# Patient Record
Sex: Female | Born: 1965 | Race: White | Hispanic: No | Marital: Married | Smoking: Current every day smoker
Health system: Southern US, Community
[De-identification: ages and names within clinical notes are randomized; demographics above are authoritative.]

## PROBLEM LIST (undated history)

## (undated) DIAGNOSIS — I10 Essential (primary) hypertension: Secondary | ICD-10-CM

## (undated) HISTORY — PX: TUBAL LIGATION: SHX77

---

## 2006-02-11 ENCOUNTER — Ambulatory Visit (HOSPITAL_COMMUNITY): Admission: RE | Admit: 2006-02-11 | Discharge: 2006-02-11 | Payer: Self-pay | Admitting: Obstetrics & Gynecology

## 2006-03-13 ENCOUNTER — Ambulatory Visit: Payer: Self-pay | Admitting: Orthopedic Surgery

## 2008-06-25 IMAGING — CR DG CERVICAL SPINE COMPLETE 4+V
5 series · 5 of 5 positions shown · non-contrast
Comparison: None.

CLINICAL DATA: 40-year-old female with a tingling in arms for over one year.
CERVICAL SPINE ? 4 VIEW:

[view not recorded (1 of 5)]
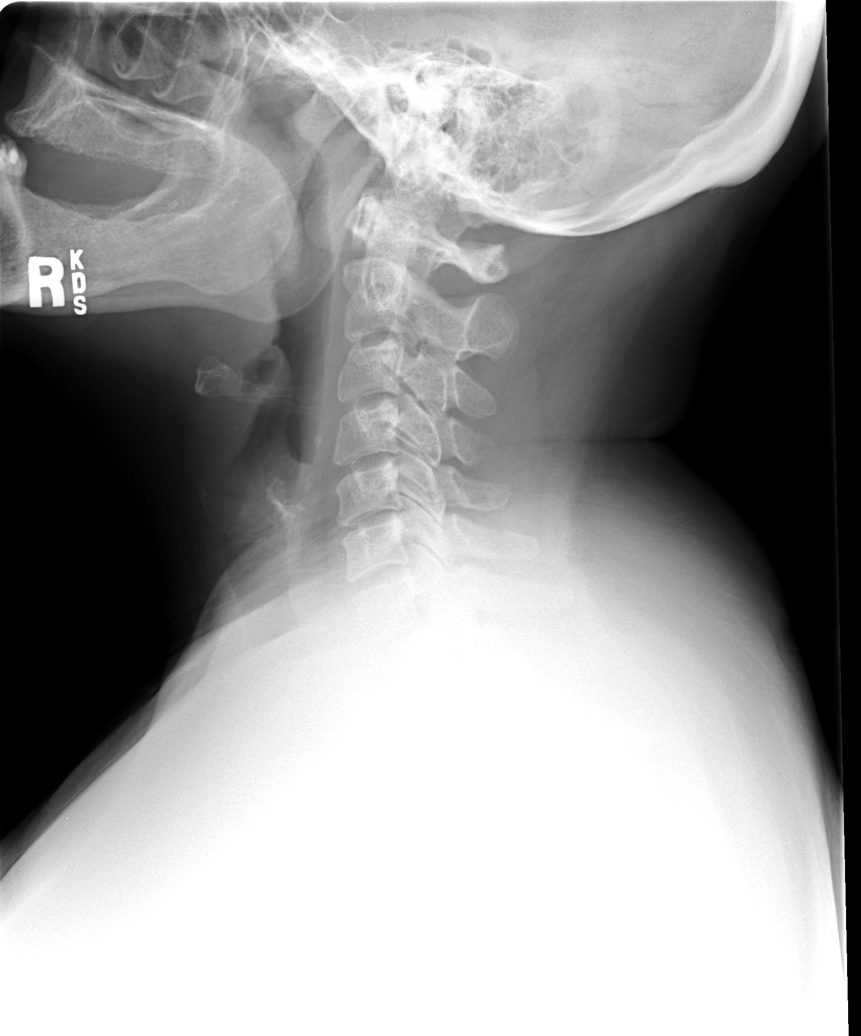

[view not recorded (2 of 5)]
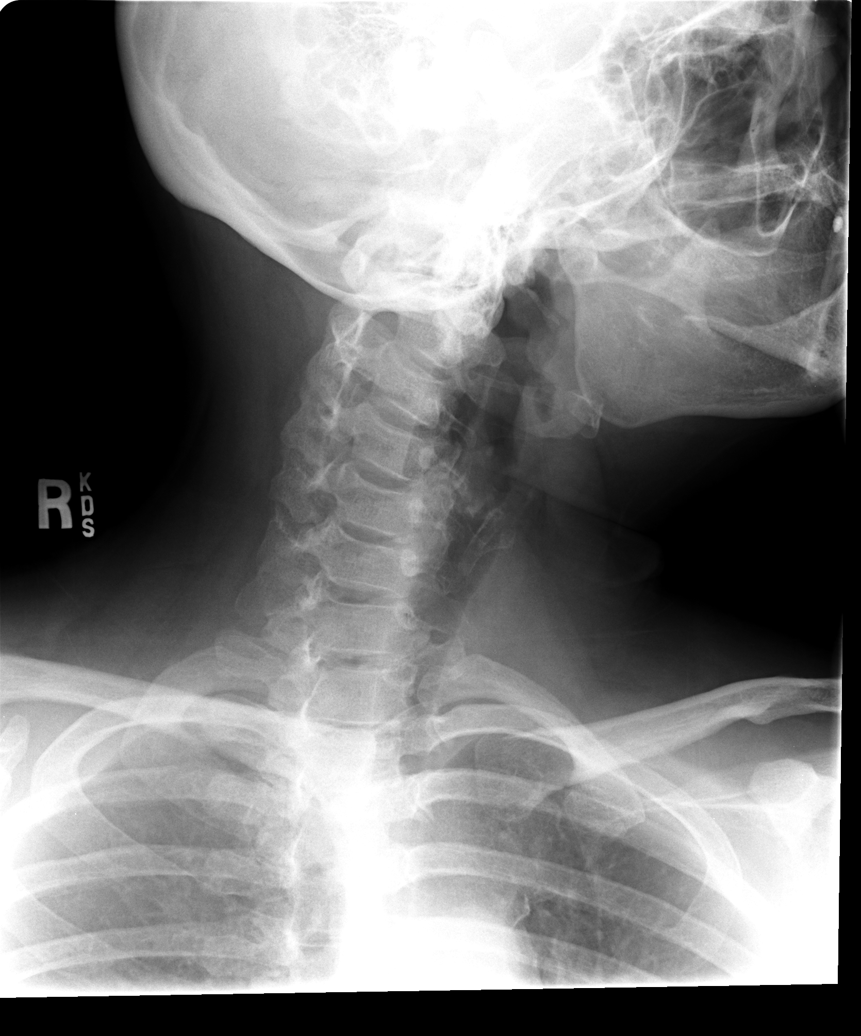

[view not recorded (3 of 5)]
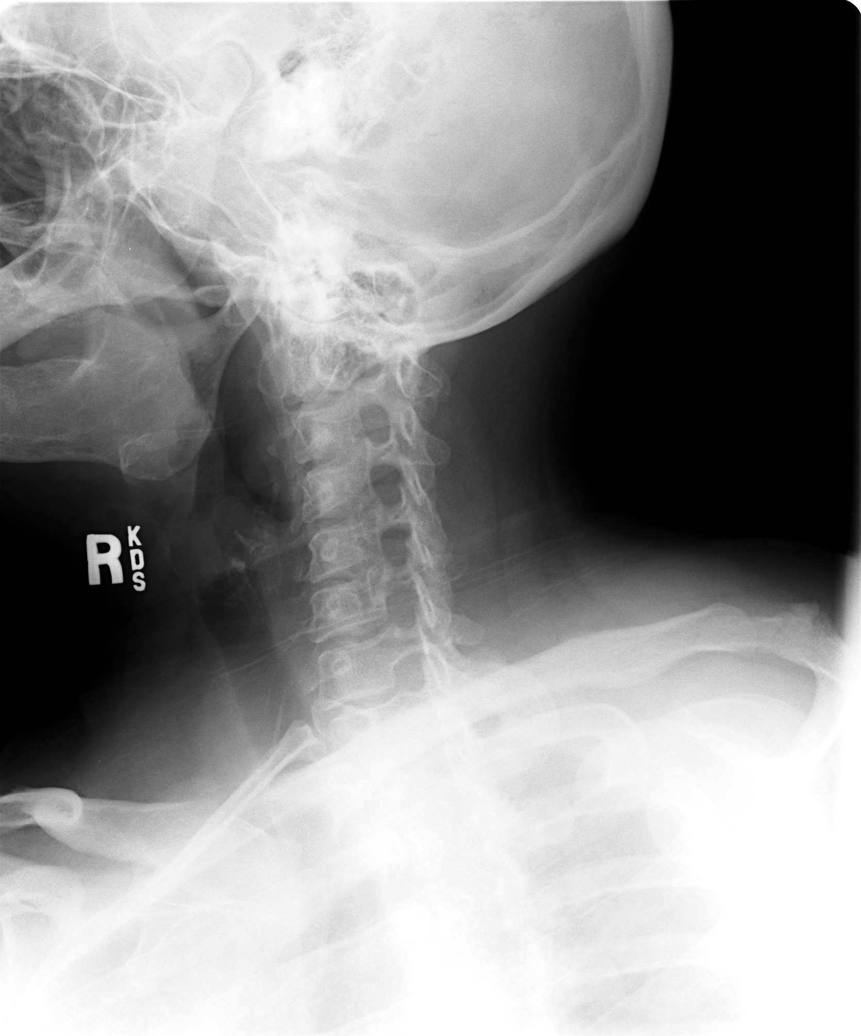

[view not recorded (4 of 5)]
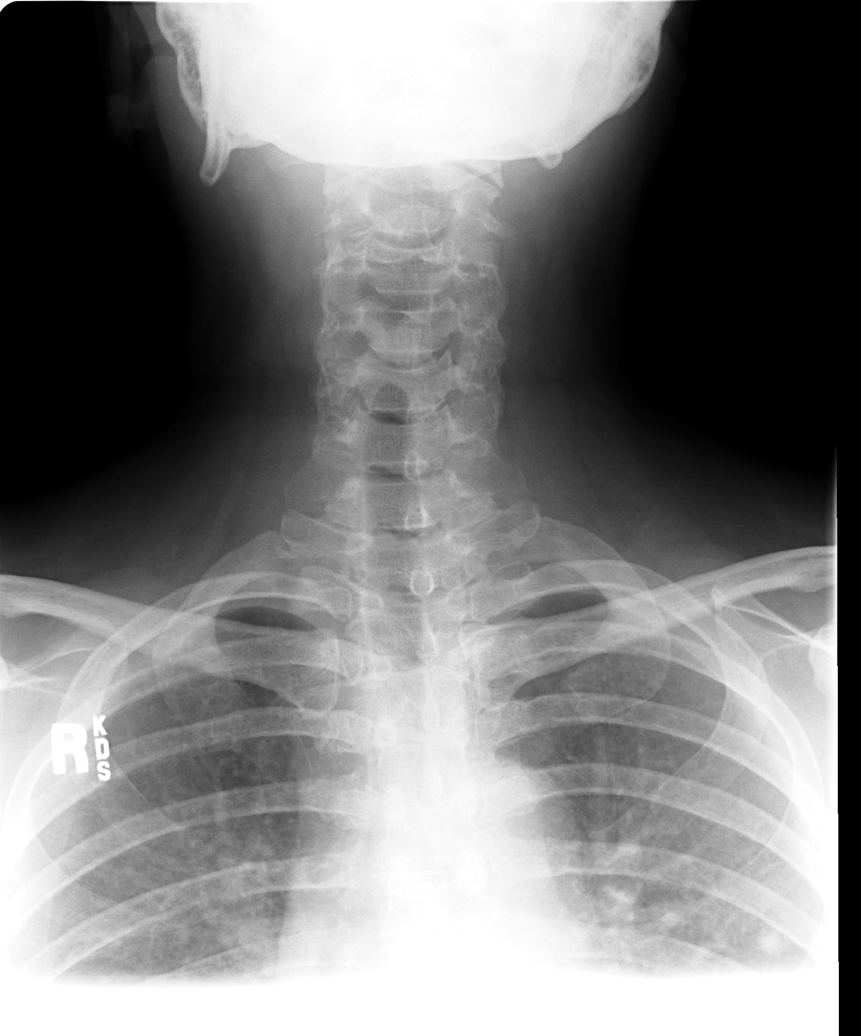

[view not recorded (5 of 5)]
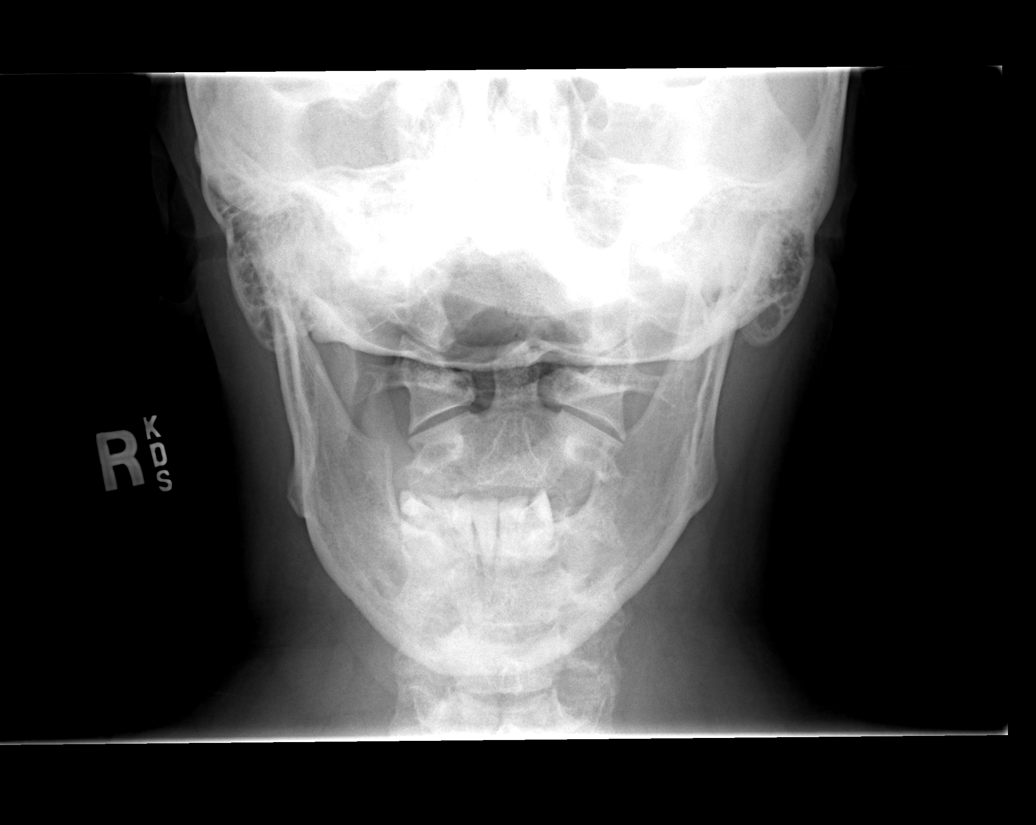

[5 of 5 positions shown; findings below may reference images not displayed]

FINDINGS: The alignment of the cervical spine is normal.  The vertebral body heights and the disk spaces are well preserved.  The facet joints are all well aligned.  No significant neural foraminal stenosis is noted on plain films.  Patient is noted to be edentulous.
IMPRESSION: 1.   No acute findings. 
2.  If patient?s symptoms persist, an MRI would be recommended.

## 2011-04-16 ENCOUNTER — Encounter (HOSPITAL_COMMUNITY): Payer: Self-pay | Admitting: Emergency Medicine

## 2011-04-16 ENCOUNTER — Emergency Department (HOSPITAL_COMMUNITY)
Admission: EM | Admit: 2011-04-16 | Discharge: 2011-04-16 | Disposition: A | Discharge status: Home / Self Care | Payer: BC Managed Care – PPO | Attending: Emergency Medicine | Admitting: Emergency Medicine

## 2011-04-16 DIAGNOSIS — L409 Psoriasis, unspecified: Secondary | ICD-10-CM

## 2011-04-16 DIAGNOSIS — F17200 Nicotine dependence, unspecified, uncomplicated: Secondary | ICD-10-CM | POA: Insufficient documentation

## 2011-04-16 DIAGNOSIS — R61 Generalized hyperhidrosis: Secondary | ICD-10-CM | POA: Insufficient documentation

## 2011-04-16 DIAGNOSIS — L408 Other psoriasis: Secondary | ICD-10-CM | POA: Insufficient documentation

## 2011-04-16 DIAGNOSIS — J3489 Other specified disorders of nose and nasal sinuses: Secondary | ICD-10-CM | POA: Insufficient documentation

## 2011-04-16 DIAGNOSIS — H81399 Other peripheral vertigo, unspecified ear: Secondary | ICD-10-CM | POA: Insufficient documentation

## 2011-04-16 DIAGNOSIS — R112 Nausea with vomiting, unspecified: Secondary | ICD-10-CM | POA: Insufficient documentation

## 2011-04-16 DIAGNOSIS — I10 Essential (primary) hypertension: Secondary | ICD-10-CM | POA: Insufficient documentation

## 2011-04-16 HISTORY — DX: Essential (primary) hypertension: I10

## 2011-04-16 LAB — GLUCOSE, CAPILLARY

## 2011-04-16 LAB — POCT I-STAT TROPONIN I: Troponin i, poc: 0 ng/mL (ref 0.00–0.08)

## 2011-04-16 MED ORDER — TRIAMCINOLONE ACETONIDE 0.1 % EX CREA: TOPICAL_CREAM | Freq: Two times a day (BID) | CUTANEOUS | Status: AC

## 2011-04-16 NOTE — ED Notes (Signed)
Pt c/o dizziness/n/clamy and vomiting x 1 at 1500 today. Pt denies ha/cp/sob.

## 2011-04-16 NOTE — Discharge Instructions (Signed)
Vertigo  Vertigo means you feel like you or your surroundings are moving when they are not. Vertigo can be dangerous if it occurs when you are at work, driving, or performing difficult activities.   CAUSES   Vertigo occurs when there is a conflict of signals sent to your brain from the visual and sensory systems in your body. There are many different causes of vertigo, including:   Infections, especially in the inner ear.   A bad reaction to a drug or misuse of alcohol and medicines.   Withdrawal from drugs or alcohol.   Rapidly changing positions, such as lying down or rolling over in bed.   A migraine headache.   Decreased blood flow to the brain.   Increased pressure in the brain from a head injury, infection, tumor, or bleeding.  SYMPTOMS   You may feel as though the world is spinning around or you are falling to the ground. Because your balance is upset, vertigo can cause nausea and vomiting. You may have involuntary eye movements (nystagmus).  DIAGNOSIS   Vertigo is usually diagnosed by physical exam. If the cause of your vertigo is unknown, your caregiver may perform imaging tests, such as an MRI scan (magnetic resonance imaging).  TREATMENT   Most cases of vertigo resolve on their own, without treatment. Depending on the cause, your caregiver may prescribe certain medicines. If your vertigo is related to body position issues, your caregiver may recommend movements or procedures to correct the problem. In rare cases, if your vertigo is caused by certain inner ear problems, you may need surgery.  HOME CARE INSTRUCTIONS    Follow your caregiver's instructions.   Avoid driving.   Avoid operating heavy machinery.   Avoid performing any tasks that would be dangerous to you or others during a vertigo episode.   Tell your caregiver if you notice that certain medicines seem to be causing your vertigo. Some of the medicines used to treat vertigo episodes can actually make them worse in some people.  SEEK  IMMEDIATE MEDICAL CARE IF:    Your medicines do not relieve your vertigo or are making it worse.   You develop problems with talking, walking, weakness, or using your arms, hands, or legs.   You develop severe headaches.   Your nausea or vomiting continues or gets worse.   You develop visual changes.   A family member notices behavioral changes.   Your condition gets worse.  MAKE SURE YOU:   Understand these instructions.   Will watch your condition.   Will get help right away if you are not doing well or get worse.  Document Released: 10/10/2004 Document Revised: 12/20/2010 Document Reviewed: 07/19/2010  Exit[employer] Patient Information 2012 [employer]          Dizziness  Dizziness is a common problem. It is a feeling of unsteadiness or lightheadedness. You may feel like you are about to faint. Dizziness can lead to injury if you stumble or fall. A person of any age group can suffer from dizziness, but dizziness is more common in older adults.  CAUSES   Dizziness can be caused by many different things, including:   Middle ear problems.   Standing for too long.   Infections.   An allergic reaction.   Aging.   An emotional response to something, such as the sight of blood.   Side effects of medicines.   Fatigue.   Problems with circulation or blood pressure.   Excess use of alcohol,   medicines, or illegal drug use.   Breathing too fast (hyperventilation).   An arrhythmia or problems with your heart rhythm.   Low red blood cell count (anemia).   Pregnancy.   Vomiting, diarrhea, fever, or other illnesses that cause dehydration.   Diseases or conditions such as Parkinson's disease, high blood pressure (hypertension), diabetes, and thyroid problems.   Exposure to extreme heat.  DIAGNOSIS   To find the cause of your dizziness, your caregiver may do a physical exam, lab tests, radiologic imaging scans, or an electrocardiography test (ECG).   TREATMENT   Treatment of dizziness depends on the  cause of your symptoms and can vary greatly.  HOME CARE INSTRUCTIONS    Drink enough fluids to keep your urine clear or pale yellow. This is especially important in very hot weather. In the elderly, it is also important in cold weather.   If your dizziness is caused by medicines, take them exactly as directed. When taking blood pressure medicines, it is especially important to get up slowly.   Rise slowly from chairs and steady yourself until you feel okay.   In the morning, first sit up on the side of the bed. When this seems okay, stand slowly while holding onto something until you know your balance is fine.   If you need to stand in one place for a long time, be sure to move your legs often. Tighten and relax the muscles in your legs while standing.   If dizziness continues to be a problem, have someone stay with you for a day or two. Do this until you feel you are well enough to stay alone. Have the person call your caregiver if he or she notices changes in you that are concerning.   Do not drive or use heavy machinery if you feel dizzy.  SEEK IMMEDIATE MEDICAL CARE IF:    Your dizziness or lightheadedness gets worse.   You feel nauseous or vomit.   You develop problems with talking, walking, weakness, or using your arms, hands, or legs.   You are not thinking clearly or you have difficulty forming sentences. It may take a friend or family member to determine if your thinking is normal.   You develop chest pain, abdominal pain, shortness of breath, or sweating.   Your vision changes.   You notice any bleeding.   You have side effects from medicine that seems to be getting worse rather than better.  MAKE SURE YOU:    Understand these instructions.   Will watch your condition.   Will get help right away if you are not doing well or get worse.  Document Released: 06/26/2000 Document Revised: 12/20/2010 Document Reviewed: 07/20/2010  [employer] Patient Information 2012 [employer].

## 2011-04-16 NOTE — ED Provider Notes (Signed)
History  This chart was scribed for Felisa Bonier, MD by Bennett Scrape. This patient was seen in room APA09/APA09 and the patient's care was started at 6:33PM.  CSN: [id number]  Arrival date & time 04/16/11  1656   First MD Initiated Contact with Patient 04/16/11 1748      Chief Complaint  Patient presents with  . Dizziness    The history is provided by the patient. No language interpreter was used.    Alison Walter is a 46 y.o. female who presents to the Emergency Department complaining of 30 minutes of sudden onset, gradually improving, constant dizziness that occurred about 4 hours ago. The dizziness is described as her feeling like she was spinning. She reports that the dizziness improved with laying down but was still felt. Pt lists diaphoresis, nausea and one episode of emesis as associated symptoms. She denies having any prior episodes of similar symptoms. She states that all of the symptoms have resolved now and that she still wanted to be evaluated to make sure it was nothing serious. She denies tinnitus, chest pain, SOB, numbness and focal weakness as associated symptoms. She c/o 12 hours of mild nasal congestion today. She also reports another recent illness about 2 weeks ago that has since resolved. She denies a recent head injury being the cause of the symptoms. Pt is also requesting a topical cream for her psoriasis. She states that she hasn't seen a specialist in years but is having a flare up and would like to be treated for that as well. Pt has a h/o HTN. She is a current everyday smoker and an occassional alcohol user.  Past Medical History  Diagnosis Date  . Hypertension     Past Surgical History  Procedure Date  . Cesarean section   . Tubal ligation     No family history on file.  History  Substance Use Topics  . Smoking status: Current Everyday Smoker -- 0.5 packs/day  . Smokeless tobacco: Not on file  . Alcohol Use: Yes     occasional     Review  of Systems  Constitutional: Positive for diaphoresis. Negative for fever and chills.  HENT: Positive for congestion. Negative for sore throat and neck pain.   Eyes: Negative for pain.  Respiratory: Negative for cough and shortness of breath.   Cardiovascular: Negative for chest pain.  Gastrointestinal: Positive for nausea and vomiting. Negative for abdominal pain and diarrhea.  Genitourinary: Negative for dysuria, urgency and hematuria.  Musculoskeletal: Negative for back pain.  Skin: Negative for rash.  Neurological: Positive for dizziness. Negative for seizures and headaches.  Psychiatric/Behavioral: Negative for confusion.    Allergies  Review of patient's allergies indicates no known allergies.  Home Medications  No current outpatient prescriptions on file.  Triage Vitals: BP 167/80  Pulse 109  Temp(Src) 97.9 F (36.6 C) (Oral)  Resp 18  Ht 4\' 11"  (1.499 m)  Wt 170 lb (77.111 kg)  BMI 34.34 kg/m2  SpO2 100%    Physical Exam  Nursing note and vitals reviewed. Constitutional: She is oriented to person, place, and time. She appears well-developed and well-nourished.  HENT:  Head: Normocephalic and atraumatic.       TMs are normal bilaterally, moist mucous membranes, negative head impulse testing  Eyes: EOM are normal. Pupils are equal, round, and reactive to light. No scleral icterus.  Cardiovascular: Normal rate, regular rhythm and normal heart sounds.  Exam reveals no gallop and no friction rub.   No  murmur heard. Pulmonary/Chest: Effort normal and breath sounds normal. No respiratory distress. She has no wheezes. She has no rales.       Lungs are clear to ausculation, no rhonchi  Abdominal: Soft. Bowel sounds are normal.  Musculoskeletal: Normal range of motion. She exhibits no edema (No lower extremity edema).  Neurological: She is alert and oriented to person, place, and time.       Negative Dix-Hallpike maneuver, normal coordination to shin to heel test, normal  finger nose finger test, negative test of skew, normal gait, negative rhomberg  Skin: Skin is warm and dry.       Extensor surface of forearms has psoriatic areas that are erythemas with silvery scale  Psychiatric: She has a normal mood and affect. Her behavior is normal.    ED Course  Procedures (including critical care time)  DIAGNOSTIC STUDIES: Oxygen Saturation is 100% on room air, normal by my interpretation.    COORDINATION OF CARE: 6:41PM-Discussed no need for CT scan due to normal neurological exam. Discussed normal EKG with pt and pt acknowledged results. Discussed blood work with pt and pt agreed to plan.   Labs Reviewed  GLUCOSE, CAPILLARY - Abnormal; Notable for the following:    Glucose-Capillary 100 (*)    All other components within normal limits  POCT I-STAT TROPONIN I   No results found.   No diagnosis found.   Date: 04/16/2011  Rate: 93  Rhythm: normal sinus rhythm  QRS Axis: normal  Intervals: normal  ST/T Wave abnormalities: normal  Conduction Disutrbances: none  Narrative Interpretation: unremarkable      MDM  The patient does not have any findings on her neurologic exam to suggest a central source of vertigo. Her HI NTS exam (negative head impulse testing, negative nystagmus, negative test of skew) as well as the rest of her examination including gait and coordination are all normal. The patient's symptoms are not reproducible to Dix-Hallpike maneuver. The patient is having no further symptoms and has had no headache or other neurologic symptoms. I do not think that this is a central neurologic process and will not order a head CT scan as I do not find indicated at this time. I explained this to the patient and she stated her understanding of and agreement with the plan of care. Furthermore, I think that this was an episodic peripheral vertigo, now resolved and without underlying electrolyte abnormality, arrhythmia, or myocardial ischemia/infarction  suggested were detected. I will discharge the patient home.      I personally performed the services described in this documentation, which was scribed in my presence. The recorded information has been reviewed and considered.    Felisa Bonier, MD 04/16/11 2017

## 2011-04-17 LAB — POCT I-STAT, CHEM 8
Creatinine, Ser: 0.7 mg/dL (ref 0.50–1.10)
HCT: 42 % (ref 36.0–46.0)
Sodium: 139 mEq/L (ref 135–145)
TCO2: 25 mmol/L (ref 0–100)

## 2019-07-13 ENCOUNTER — Other Ambulatory Visit: Payer: Self-pay

## 2019-07-13 ENCOUNTER — Emergency Department (HOSPITAL_COMMUNITY)
Admission: EM | Admit: 2019-07-13 | Discharge: 2019-07-13 | Disposition: A | Discharge status: Home / Self Care | Payer: Self-pay | Attending: Emergency Medicine | Admitting: Emergency Medicine

## 2019-07-13 ENCOUNTER — Encounter (HOSPITAL_COMMUNITY): Payer: Self-pay | Admitting: Emergency Medicine

## 2019-07-13 DIAGNOSIS — F17210 Nicotine dependence, cigarettes, uncomplicated: Secondary | ICD-10-CM | POA: Insufficient documentation

## 2019-07-13 DIAGNOSIS — Z79899 Other long term (current) drug therapy: Secondary | ICD-10-CM | POA: Insufficient documentation

## 2019-07-13 DIAGNOSIS — I10 Essential (primary) hypertension: Secondary | ICD-10-CM | POA: Insufficient documentation

## 2019-07-13 DIAGNOSIS — R42 Dizziness and giddiness: Secondary | ICD-10-CM | POA: Insufficient documentation

## 2019-07-13 LAB — URINALYSIS, ROUTINE W REFLEX MICROSCOPIC
Bilirubin Urine: NEGATIVE
Glucose, UA: NEGATIVE mg/dL
Hgb urine dipstick: NEGATIVE
Ketones, ur: NEGATIVE mg/dL
Leukocytes,Ua: NEGATIVE
Nitrite: NEGATIVE
Protein, ur: NEGATIVE mg/dL
Specific Gravity, Urine: 1.006 (ref 1.005–1.030)
pH: 6 (ref 5.0–8.0)

## 2019-07-13 LAB — BASIC METABOLIC PANEL
Anion gap: 12 (ref 5–15)
BUN: 17 mg/dL (ref 6–20)
CO2: 22 mmol/L (ref 22–32)
Calcium: 9.1 mg/dL (ref 8.9–10.3)
Chloride: 104 mmol/L (ref 98–111)
Creatinine, Ser: 0.7 mg/dL (ref 0.44–1.00)
GFR calc Af Amer: >60 mL/min (ref >60)
GFR calc non Af Amer: >60 mL/min (ref >60)
Glucose, Bld: 129 mg/dL — ABNORMAL HIGH (ref 70–99)
Potassium: 3.9 mmol/L (ref 3.5–5.1)
Sodium: 138 mmol/L (ref 135–145)

## 2019-07-13 LAB — CBC WITH DIFFERENTIAL/PLATELET
Abs Immature Granulocytes: 0.01 10*3/uL (ref 0.00–0.07)
Basophils Absolute: 0 10*3/uL (ref 0.0–0.1)
Basophils Relative: 1 %
Eosinophils Absolute: 0.1 10*3/uL (ref 0.0–0.5)
Eosinophils Relative: 3 %
HCT: 46.7 % — ABNORMAL HIGH (ref 36.0–46.0)
Hemoglobin: 15.7 g/dL — ABNORMAL HIGH (ref 12.0–15.0)
Immature Granulocytes: 0 %
Lymphocytes Relative: 21 %
Lymphs Abs: 0.8 10*3/uL (ref 0.7–4.0)
MCH: 34.2 pg — ABNORMAL HIGH (ref 26.0–34.0)
MCHC: 33.6 g/dL (ref 30.0–36.0)
MCV: 101.7 fL — ABNORMAL HIGH (ref 80.0–100.0)
Monocytes Absolute: 0.3 10*3/uL (ref 0.1–1.0)
Monocytes Relative: 7 %
Neutro Abs: 2.6 10*3/uL (ref 1.7–7.7)
Neutrophils Relative %: 68 %
Platelets: 262 10*3/uL (ref 150–400)
RBC: 4.59 MIL/uL (ref 3.87–5.11)
RDW: 12.6 % (ref 11.5–15.5)
WBC: 3.8 10*3/uL — ABNORMAL LOW (ref 4.0–10.5)
nRBC: 0 % (ref 0.0–0.2)

## 2019-07-13 MED ORDER — AMLODIPINE BESYLATE 5 MG PO TABS
5.0000 mg | ORAL_TABLET | Freq: Once | ORAL | Status: AC
  Administered 2019-07-13: 5 mg via ORAL
  Filled 2019-07-13: qty 1

## 2019-07-13 MED ORDER — AMLODIPINE BESYLATE 5 MG PO TABS: 5.0000 mg | ORAL_TABLET | Freq: Every day | ORAL | 1 refills | Status: AC

## 2019-07-13 NOTE — ED Triage Notes (Signed)
Pt reports she used to take BP meds years ago but stopped taking them bc they make her feel bad, BP was checked at work today and she reports it was 200/100, triage BP 198/93, pt reports she has no PCP to obtain RXs from

## 2019-07-13 NOTE — ED Provider Notes (Signed)
[facility] EMERGENCY DEPARTMENT Provider Note   CSN: [id number] Arrival date & time: 07/13/19  4098     History Chief Complaint  Patient presents with  . Hypertension    Alison Walter is a 54 y.o. female with a history of hypertension, but has not been on medications for probably 10 years secondary to intolerable side effects (patient does not recall the name of the medication) but reports it made her feel weak, presenting for evaluation of an elevated blood pressure.  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  She denies any focal weakness, also no headache, vision changes, chest pain, shortness of breath, palpitations, peripheral edema.  She endorses a history of vertigo, and reports occasional episodes like occurred this morning which she has just chalked up to episodes of vertigo.  She has had no earache, no tinnitus, also no fevers or chills.  She has had no treatment prior to arrival for her symptoms.  HPI     Past Medical History:  Diagnosis Date  . Hypertension     There are no problems to display for this patient.   Past Surgical History:  Procedure Laterality Date  . CESAREAN SECTION    . TUBAL LIGATION       OB History   No obstetric history on file.     No family history on file.  Social History   Tobacco Use  . Smoking status: Current Every Day Smoker    Packs/day: 0.50  . Smokeless tobacco: Never Used  Vaping Use  . Vaping Use: Never used  Substance Use Topics  . Alcohol use: Yes    Comment: occasional  . Drug use: No    Home Medications Prior to Admission medications   Medication Sig Start Date End Date Taking? Authorizing Provider  ibuprofen (ADVIL,MOTRIN) 200 MG tablet Take 600 mg by mouth once as needed. FOR PAIN   Yes [provider]  amLODipine  (NORVASC) 5 MG tablet Take 1 tablet (5 mg total) by mouth daily. 07/13/19   Burgess Amor, PA-C    Allergies    Patient has no known allergies.  Review of Systems   Review of Systems  Constitutional: Negative for fever.  HENT: Negative for congestion and sore throat.   Eyes: Negative.  Negative for visual disturbance.  Respiratory: Negative for chest tightness and shortness of breath.   Cardiovascular: Negative for chest pain.  Gastrointestinal: Negative for abdominal pain, nausea and vomiting.  Genitourinary: Negative.   Musculoskeletal: Negative for arthralgias, joint swelling and neck pain.  Skin: Negative.  Negative for rash and wound.  Neurological: Positive for dizziness. Negative for weakness, light-headedness, numbness and headaches.  Psychiatric/Behavioral: Negative.     Physical Exam Updated Vital Signs BP (!) 190/89   Pulse (!) 101   Temp 98 F (36.7 C) (Oral)   Resp 16   Ht 5' (1.524 m)   Wt 81.6 kg   LMP 04/02/2011   SpO2 98%   BMI 35.15 kg/m   Physical Exam Vitals and nursing note reviewed.  Constitutional:      Appearance: She is well-developed.  HENT:     Head: Normocephalic and atraumatic.  Eyes:     Pupils: Pupils are equal, round, and reactive to light.  Cardiovascular:     Rate and Rhythm: Normal  rate.     Heart sounds: Normal heart sounds.  Pulmonary:     Effort: Pulmonary effort is normal.  Abdominal:     Palpations: Abdomen is soft.     Tenderness: There is no abdominal tenderness.  Musculoskeletal:        General: Normal range of motion.     Cervical back: Normal range of motion and neck supple.  Lymphadenopathy:     Cervical: No cervical adenopathy.  Skin:    General: Skin is warm and dry.     Findings: No rash.  Neurological:     General: No focal deficit present.     Mental Status: She is alert and oriented to person, place, and time.     GCS: GCS eye subscore is 4. GCS verbal subscore is 5. GCS motor subscore is 6.     Sensory:  No sensory deficit.     Coordination: Heel to [facility] Test normal. Rapid alternating movements normal.     Gait: Gait normal.     Comments: Normal heel-shin, normal rapid alternating movements. Cranial nerves III-XII intact.  No pronator drift.  Psychiatric:        Speech: Speech normal.        Behavior: Behavior normal.        Thought Content: Thought content normal.     ED Results / Procedures / Treatments   Labs (all labs ordered are listed, but only abnormal results are displayed) Labs Reviewed  BASIC METABOLIC PANEL - Abnormal; Notable for the following components:      Result Value   Glucose, Bld 129 (*)    All other components within normal limits  CBC WITH DIFFERENTIAL/PLATELET - Abnormal; Notable for the following components:   WBC 3.8 (*)    Hemoglobin 15.7 (*)    HCT 46.7 (*)    MCV 101.7 (*)    MCH 34.2 (*)    All other components within normal limits  URINALYSIS, ROUTINE W REFLEX MICROSCOPIC    EKG EKG Interpretation  Date/Time:  Tuesday July 13 2019 09:10:15 EDT Ventricular Rate:  90 PR Interval:    QRS Duration: 85 QT Interval:  352 QTC Calculation: 431 R Axis:   6 Text Interpretation: Sinus rhythm Anterior infarct, old No STEMI Confirmed by Alvester Chou [phone]) on 07/13/2019 9:48:02 AM   Radiology No results found.  Procedures Procedures (including critical care time)  Medications Ordered in ED Medications  amLODipine (NORVASC) tablet 5 mg (5 mg Oral Given 07/13/19 1044)    ED Course  I have reviewed the triage vital signs and the nursing notes.  Pertinent labs & imaging results that were available during my care of the patient were reviewed by me and considered in my medical decision making (see chart for details).    MDM Rules/Calculators/A&P                          Labs and EKG reviewed, no acute findings.  Patient was started on amlodipine with first dose given here.  She has a normal neurologic exam today with no signs of acute CVA.   She is ambulatory, no dizziness here.  She was given referrals for follow-up care.  Discussed with Dr. Renaye Rakers prior to discharge home. Final Clinical Impression(s) / ED Diagnoses Final diagnoses:  Essential hypertension    Rx / DC Orders ED Discharge Orders         Ordered    amLODipine (NORVASC) 5 MG tablet  Daily     Discontinue  Reprint     07/13/19 1109           Burgess Amor, Cordelia Poche 07/13/19 1112    Terald Sleeper, MD 07/13/19 1919

## 2019-07-13 NOTE — Discharge Instructions (Addendum)
As discussed, your lab test and your exam is reassuring today.  It is very important that she get your blood pressure under better control, uncontrolled blood pressure can lead to stroke, heart disease and kidney failure.  Take the medication prescribed daily.  I recommend a repeat blood pressure within 1 week to evaluate for hopeful improving blood pressure.  You may go to the clinic outlined below for this blood pressure recheck.  [facility]  [address] [place], [place] [address] [phone]  Services [facility] offers a variety of basic health services.  Services include but are not limited to: Blood pressure checks  Heart rate checks  Blood sugar checks  Urine analysis  Rapid strep tests  Pregnancy tests.  Health education and referrals  People needing more complex services will be directed to a physician online. Using these virtual visits, doctors can evaluate and prescribe medicine and treatments. There will be no medication on-site, though [facility] will help patients fill their prescriptions at little to no cost.   For More information please go to: [email or web address]

## 2019-11-21 ENCOUNTER — Encounter (HOSPITAL_COMMUNITY): Payer: Self-pay | Admitting: Emergency Medicine

## 2019-11-21 ENCOUNTER — Other Ambulatory Visit: Payer: Self-pay

## 2019-11-21 ENCOUNTER — Emergency Department (HOSPITAL_COMMUNITY)
Admission: EM | Admit: 2019-11-21 | Discharge: 2019-11-21 | Disposition: A | Discharge status: Home / Self Care | Payer: 59 | Attending: Emergency Medicine | Admitting: Emergency Medicine

## 2019-11-21 DIAGNOSIS — I10 Essential (primary) hypertension: Secondary | ICD-10-CM | POA: Insufficient documentation

## 2019-11-21 DIAGNOSIS — F17200 Nicotine dependence, unspecified, uncomplicated: Secondary | ICD-10-CM | POA: Insufficient documentation

## 2019-11-21 DIAGNOSIS — Z79899 Other long term (current) drug therapy: Secondary | ICD-10-CM | POA: Diagnosis not present

## 2019-11-21 DIAGNOSIS — T783XXA Angioneurotic edema, initial encounter: Secondary | ICD-10-CM | POA: Insufficient documentation

## 2019-11-21 DIAGNOSIS — R220 Localized swelling, mass and lump, head: Secondary | ICD-10-CM | POA: Diagnosis present

## 2019-11-21 LAB — CBC WITH DIFFERENTIAL/PLATELET
Abs Immature Granulocytes: 0.02 10*3/uL (ref 0.00–0.07)
Basophils Absolute: 0 10*3/uL (ref 0.0–0.1)
Basophils Relative: 1 %
Eosinophils Absolute: 0.1 10*3/uL (ref 0.0–0.5)
Eosinophils Relative: 1 %
HCT: 41.4 % (ref 36.0–46.0)
Hemoglobin: 14.6 g/dL (ref 12.0–15.0)
Immature Granulocytes: 0 %
Lymphocytes Relative: 18 %
Lymphs Abs: 1.2 10*3/uL (ref 0.7–4.0)
MCH: 34.6 pg — ABNORMAL HIGH (ref 26.0–34.0)
MCHC: 35.3 g/dL (ref 30.0–36.0)
MCV: 98.1 fL (ref 80.0–100.0)
Monocytes Absolute: 0.4 10*3/uL (ref 0.1–1.0)
Monocytes Relative: 6 %
Neutro Abs: 4.8 10*3/uL (ref 1.7–7.7)
Neutrophils Relative %: 74 %
Platelets: 333 10*3/uL (ref 150–400)
RBC: 4.22 MIL/uL (ref 3.87–5.11)
RDW: 13.3 % (ref 11.5–15.5)
WBC: 6.5 10*3/uL (ref 4.0–10.5)
nRBC: 0 % (ref 0.0–0.2)

## 2019-11-21 LAB — BASIC METABOLIC PANEL
Anion gap: 10 (ref 5–15)
BUN: 21 mg/dL — ABNORMAL HIGH (ref 6–20)
CO2: 29 mmol/L (ref 22–32)
Calcium: 9.3 mg/dL (ref 8.9–10.3)
Chloride: 97 mmol/L — ABNORMAL LOW (ref 98–111)
Creatinine, Ser: 0.85 mg/dL (ref 0.44–1.00)
GFR, Estimated: >60 mL/min (ref >60)
Glucose, Bld: 130 mg/dL — ABNORMAL HIGH (ref 70–99)
Potassium: 3.5 mmol/L (ref 3.5–5.1)
Sodium: 136 mmol/L (ref 135–145)

## 2019-11-21 MED ORDER — METHYLPREDNISOLONE SODIUM SUCC 125 MG IJ SOLR
125.0000 mg | Freq: Once | INTRAMUSCULAR | Status: AC
  Administered 2019-11-21: 125 mg via INTRAVENOUS
  Filled 2019-11-21: qty 2

## 2019-11-21 MED ORDER — ONDANSETRON HCL 4 MG/2ML IJ SOLN
4.0000 mg | Freq: Once | INTRAMUSCULAR | Status: AC
  Administered 2019-11-21: 4 mg via INTRAVENOUS

## 2019-11-21 MED ORDER — PREDNISONE 10 MG PO TABS: 20.0000 mg | ORAL_TABLET | Freq: Every day | ORAL | 0 refills | Status: DC

## 2019-11-21 MED ORDER — ONDANSETRON HCL 4 MG/2ML IJ SOLN
INTRAMUSCULAR | Status: AC
  Administered 2019-11-21: 4 mg via INTRAVENOUS
  Filled 2019-11-21: qty 2

## 2019-11-21 MED ORDER — DIPHENHYDRAMINE HCL 50 MG/ML IJ SOLN
25.0000 mg | Freq: Once | INTRAMUSCULAR | Status: AC
  Administered 2019-11-21: 25 mg via INTRAVENOUS
  Filled 2019-11-21: qty 1

## 2019-11-21 MED ORDER — FAMOTIDINE IN NACL 20-0.9 MG/50ML-% IV SOLN
20.0000 mg | Freq: Once | INTRAVENOUS | Status: AC
Start: 2019-11-21 — End: 2019-11-21
  Administered 2019-11-21: 20 mg via INTRAVENOUS
  Filled 2019-11-21: qty 50

## 2019-11-21 NOTE — ED Triage Notes (Signed)
Pt states she woke up about 1630 with a swollen tongue. Pt started taking Lisinopril about 3 months ago.

## 2019-11-21 NOTE — ED Provider Notes (Signed)
[facility] EMERGENCY DEPARTMENT Provider Note   CSN: [id number] Arrival date & time: 11/21/19  1955     History Chief Complaint  Patient presents with  . Angioedema    Alison Walter is a 54 y.o. female.  Patient with allergic reaction most likely related to the lisinopril.  Patient complains of swelling to her lips and face  The history is provided by the patient and medical records. No language interpreter was used.  Allergic Reaction Presenting symptoms: itching   Presenting symptoms: no difficulty breathing and no rash   Severity:  Moderate Prior allergic episodes:  No prior episodes Context: not animal exposure   Relieved by:  Nothing Worsened by:  Nothing Ineffective treatments:  None tried      Past Medical History:  Diagnosis Date  . Hypertension     There are no problems to display for this patient.   Past Surgical History:  Procedure Laterality Date  . CESAREAN SECTION    . TUBAL LIGATION       OB History   No obstetric history on file.     History reviewed. No pertinent family history.  Social History   Tobacco Use  . Smoking status: Current Every Day Smoker    Packs/day: 0.50  . Smokeless tobacco: Never Used  Vaping Use  . Vaping Use: Never used  Substance Use Topics  . Alcohol use: Yes    Comment: occasional  . Drug use: No    Home Medications Prior to Admission medications   Medication Sig Start Date End Date Taking? Authorizing Provider  amLODipine (NORVASC) 5 MG tablet Take 1 tablet (5 mg total) by mouth daily. 07/13/19   Burgess Amor, PA-C  ibuprofen (ADVIL,MOTRIN) 200 MG tablet Take 600 mg by mouth once as needed. FOR PAIN    [provider]  predniSONE (DELTASONE) 10 MG tablet Take 2 tablets (20 mg total) by mouth daily. 11/21/19   Bethann Berkshire, MD    Allergies    Patient has no known allergies.  Review of Systems   Review of Systems  Constitutional: Negative for appetite change and fatigue.  HENT:  Negative for congestion, ear discharge and sinus pressure.        Swelling to lips tongue face  Eyes: Negative for discharge.  Respiratory: Negative for cough.   Cardiovascular: Negative for chest pain.  Gastrointestinal: Negative for abdominal pain and diarrhea.  Genitourinary: Negative for frequency and hematuria.  Musculoskeletal: Negative for back pain.  Skin: Positive for itching. Negative for rash.  Neurological: Negative for seizures and headaches.  Psychiatric/Behavioral: Negative for hallucinations.    Physical Exam Updated Vital Signs BP 139/66 (BP Location: Right Arm)   Pulse 85   Resp 20   Ht 5' (1.524 m)   Wt 79.4 kg   LMP 04/02/2011   SpO2 97%   BMI 34.18 kg/m   Physical Exam Vitals and nursing note reviewed.  Constitutional:      Appearance: She is well-developed.  HENT:     Head: Normocephalic.     Right Ear: Tympanic membrane normal.     Left Ear: Tympanic membrane normal.     Nose: Nose normal.     Mouth/Throat:     Comments: Swelling to tongue mild and lips mild no lip swelling in mouth Eyes:     General: No scleral icterus.    Conjunctiva/sclera: Conjunctivae normal.  Neck:     Thyroid: No thyromegaly.  Cardiovascular:     Rate and Rhythm: Normal  rate and regular rhythm.     Heart sounds: No murmur heard.  No friction rub. No gallop.   Pulmonary:     Breath sounds: No stridor. No wheezing or rales.  Chest:     Chest wall: No tenderness.  Abdominal:     General: There is no distension.     Tenderness: There is no abdominal tenderness. There is no rebound.  Musculoskeletal:        General: Normal range of motion.     Cervical back: Neck supple.  Lymphadenopathy:     Cervical: No cervical adenopathy.  Skin:    Findings: No erythema or rash.  Neurological:     Mental Status: She is alert and oriented to person, place, and time.     Motor: No abnormal muscle tone.     Coordination: Coordination normal.  Psychiatric:        Behavior:  Behavior normal.     ED Results / Procedures / Treatments   Labs (all labs ordered are listed, but only abnormal results are displayed) Labs Reviewed  CBC WITH DIFFERENTIAL/PLATELET - Abnormal; Notable for the following components:      Result Value   MCH 34.6 (*)    All other components within normal limits  BASIC METABOLIC PANEL - Abnormal; Notable for the following components:   Chloride 97 (*)    Glucose, Bld 130 (*)    BUN 21 (*)    All other components within normal limits    EKG None  Radiology No results found.  Procedures Procedures (including critical care time)  Medications Ordered in ED Medications  diphenhydrAMINE (BENADRYL) injection 25 mg (25 mg Intravenous Given 11/21/19 2026)  methylPREDNISolone sodium succinate (SOLU-MEDROL) 125 mg/2 mL injection 125 mg (125 mg Intravenous Given 11/21/19 2026)  famotidine (PEPCID) IVPB 20 mg premix (0 mg Intravenous Stopped 11/21/19 2110)  ondansetron (ZOFRAN) injection 4 mg (4 mg Intravenous Given 11/21/19 2110)    ED Course  I have reviewed the triage vital signs and the nursing notes.  Pertinent labs & imaging results that were available during my care of the patient were reviewed by me and considered in my medical decision making (see chart for details). CRITICAL CARE Performed by: Bethann Berkshire Total critical care time: 35 minutes Critical care time was exclusive of separately billable procedures and treating other patients. Critical care was necessary to treat or prevent imminent or life-threatening deterioration. Critical care was time spent personally by me on the following activities: development of treatment plan with patient and/or surrogate as well as nursing, discussions with consultants, evaluation of patient's response to treatment, examination of patient, obtaining history from patient or surrogate, ordering and performing treatments and interventions, ordering and review of laboratory studies, ordering and  review of radiographic studies, pulse oximetry and re-evaluation of patient's condition.    MDM Rules/Calculators/A&P                          Patient with angioedema.  Patient improved in the emergency department and will follow up as an outpatient.  She is put on prednisone      This patient presents to the ED for concern of allergic reaction, this involves an extensive number of treatment options, and is a complaint that carries with it a high risk of complications and morbidity.  The differential diagnosis includes angioedema from lisinopril   Lab Tests:   I Ordered, reviewed, and interpreted labs, which included CBC and chemistries  unremarkable  Medicines ordered:   I ordered medication Solu-Medrol Benadryl Pepcid  Imaging Studies ordered:   Additional history obtained:   Additional history obtained from relative  Previous records obtained and reviewed.  Consultations Obtained:     Reevaluation:  After the interventions stated above, I reevaluated the patient and found much improved  Critical Interventions:  .   Final Clinical Impression(s) / ED Diagnoses Final diagnoses:  Angioedema, initial encounter    Rx / DC Orders ED Discharge Orders         Ordered    predniSONE (DELTASONE) 10 MG tablet  Daily        11/21/19 2301           Bethann Berkshire, MD 11/24/19 1237

## 2019-11-21 NOTE — ED Notes (Signed)
PT educated with DC instructions and verbalized complete understanding denies questions at this time. PT IV removed fully intact dressing applied. VSS NAD PT denies any symptoms of swelling, speaking in full clear sentences and denies pain at this time. Pt self ambulated to exit with steady gait and husband at side to transport.

## 2019-11-21 NOTE — Discharge Instructions (Signed)
Stop taking your lisinopril.  Follow-up with your doctor this week.  Return sooner if problems.

## 2021-09-12 ENCOUNTER — Encounter: Payer: Self-pay | Admitting: *Deleted

## 2022-02-18 ENCOUNTER — Encounter: Payer: Self-pay | Admitting: *Deleted

## 2022-09-13 ENCOUNTER — Other Ambulatory Visit (HOSPITAL_COMMUNITY): Payer: Self-pay | Admitting: Family Medicine

## 2022-09-13 DIAGNOSIS — Z0000 Encounter for general adult medical examination without abnormal findings: Secondary | ICD-10-CM

## 2023-05-29 ENCOUNTER — Encounter (INDEPENDENT_AMBULATORY_CARE_PROVIDER_SITE_OTHER): Payer: Self-pay | Admitting: *Deleted

## 2023-12-23 ENCOUNTER — Ambulatory Visit
Admission: EM | Admit: 2023-12-23 | Discharge: 2023-12-23 | Disposition: A | Discharge status: Home / Self Care | Attending: Nurse Practitioner | Admitting: Nurse Practitioner

## 2023-12-23 DIAGNOSIS — S86912A Strain of unspecified muscle(s) and tendon(s) at lower leg level, left leg, initial encounter: Secondary | ICD-10-CM

## 2023-12-23 MED ORDER — KETOROLAC TROMETHAMINE 30 MG/ML IJ SOLN
30.0000 mg | Freq: Once | INTRAMUSCULAR | Status: AC
  Administered 2023-12-23: 30 mg via INTRAMUSCULAR

## 2023-12-23 NOTE — Discharge Instructions (Signed)
 I suspect you have a left knee strain.  Please avoid bearing weight on your left leg and use the knee immobilizer until you are able to follow-up with an orthopedic provider or until the pain improved significantly.  We have given you an injection of Toradol  today-do not take any other NSAIDs (ibuprofen, Advil, Motrin, Aleve) for 48 hours.  You can take Tylenol 500 to 1000 mg every 6 hours as needed for pain.  Also recommend rest, ice, elevation of the left knee to help with pain and swelling.  Follow-up with orthopedic provider if pain persists/worsens despite treatment - contact information has been provided.

## 2023-12-23 NOTE — ED Triage Notes (Signed)
 Pt present a fall yesterday, pt complain of left knee pain from the fall. Pt states unable to bare weight on her left knee.

## 2023-12-23 NOTE — ED Provider Notes (Signed)
 [facility]    CSN: [id number] Arrival date & time: 12/23/23  1428      History   Chief Complaint Chief Complaint  Patient presents with   Fall    HPI Alison Walter is a 58 y.o. female.   Patient presents today with 1 day history of left knee pain.  Reports she was taking her dog out and going down the stairs when she slipped on the ice and twisted her left knee inward.  She reports pain with ambulation now and pain with palpation of the left knee on the inside.  No bruising, swelling, redness, or decreased mobility.  Reports she feels like there is a knot in the knee.  She took some anti-inflammatories yesterday with minimal improvement.  Denies history of knee surgeries.    Past Medical History:  Diagnosis Date   Hypertension     There are no active problems to display for this patient.   Past Surgical History:  Procedure Laterality Date   CESAREAN SECTION     TUBAL LIGATION      OB History   No obstetric history on file.      Home Medications    Prior to Admission medications   Medication Sig Start Date End Date Taking? Authorizing Provider  amLODipine  (NORVASC ) 5 MG tablet Take 1 tablet (5 mg total) by mouth daily. 07/13/19   Idol, Julie, PA-C  ibuprofen (ADVIL,MOTRIN) 200 MG tablet Take 600 mg by mouth once as needed. FOR PAIN    [provider]    Family History History reviewed. No pertinent family history.  Social History Social History   Tobacco Use   Smoking status: Every Day    Current packs/day: 0.50    Types: Cigarettes   Smokeless tobacco: Never  Vaping Use   Vaping status: Never Used  Substance Use Topics   Alcohol use: Yes    Comment: occasional   Drug use: No     Allergies   Patient has no known allergies.   Review of Systems Review of Systems Per HPI  Physical Exam Triage Vital Signs ED Triage Vitals  Encounter Vitals Group     BP 12/23/23 1510 (!) 147/80     Girls Systolic BP Percentile --       Girls Diastolic BP Percentile --      Boys Systolic BP Percentile --      Boys Diastolic BP Percentile --      Pulse Rate 12/23/23 1510 (!) 108     Resp 12/23/23 1510 16     Temp 12/23/23 1510 98.2 F (36.8 C)     Temp Source 12/23/23 1510 Oral     SpO2 12/23/23 1510 96 %     Weight --      Height --      Head Circumference --      Peak Flow --      Pain Score 12/23/23 1508 10     Pain Loc --      Pain Education --      Exclude from Growth Chart --    No data found.  Updated Vital Signs BP (!) 147/80 (BP Location: Right Arm)   Pulse (!) 108   Temp 98.2 F (36.8 C) (Oral)   Resp 16   LMP 04/02/2011   SpO2 96%   Visual Acuity Right Eye Distance:   Left Eye Distance:   Bilateral Distance:    Right Eye Near:   Left Eye Near:  Bilateral Near:     Physical Exam Vitals and nursing note reviewed.  Constitutional:      General: She is not in acute distress.    Appearance: Normal appearance. She is not toxic-appearing.  HENT:     Mouth/Throat:     Mouth: Mucous membranes are moist.     Pharynx: Oropharynx is clear.  Pulmonary:     Effort: Pulmonary effort is normal. No respiratory distress.  Musculoskeletal:     Comments: Inspection: no swelling, bruising, obvious deformity or redness to left knee Palpation: tender to palpation medial joint line left knee; no obvious deformities palpated and no laxity appreciated ROM: Full ROM to right knee; left knee pain with extension and internal rotation.  No laxity appreciated.  Strength: 5/5 bilateral lower extremities Neurovascular: neurovascularly intact in distal bilateral lower extremities   Skin:    General: Skin is warm and dry.     Capillary Refill: Capillary refill takes less than 2 seconds.     Coloration: Skin is not jaundiced or pale.     Findings: No erythema.  Neurological:     Mental Status: She is alert and oriented to person, place, and time.  Psychiatric:        Behavior: Behavior is cooperative.       UC Treatments / Results  Labs (all labs ordered are listed, but only abnormal results are displayed) Labs Reviewed - No data to display  EKG   Radiology No results found.  Procedures Procedures (including critical care time)  Medications Ordered in UC Medications  ketorolac  (TORADOL ) 30 MG/ML injection 30 mg (30 mg Intramuscular Given 12/23/23 1553)    Initial Impression / Assessment and Plan / UC Course  I have reviewed the triage vital signs and the nursing notes.  Pertinent labs & imaging results that were available during my care of the patient were reviewed by me and considered in my medical decision making (see chart for details).   Patient is a pleasant, well-appearing 58 year old female presenting today for left knee pain.  On exam, she has tenderness along the medial joint line.  There are no red flags on the exam.  X-ray imaging deferred at this time as it would likely not change treatment plan from urgent care perspective.  I suspect left knee strain along medial joint line.  Knee immobilizer given, recommended nonweightbearing status and crutches which patient reports she has at home and so she declined her crutches today.  Pain treated with Toradol  30 mg IM in urgent care today; avoid NSAIDs for 48 hours.  Recommended follow-up with orthopedist if symptoms do not improve with rest over the next couple of days.  Work excuse provided.  The patient was given the opportunity to ask questions.  All questions answered to their satisfaction.  The patient is in agreement to this plan.   Final Clinical Impressions(s) / UC Diagnoses   Final diagnoses:  Knee strain, left, initial encounter     Discharge Instructions      I suspect you have a left knee strain.  Please avoid bearing weight on your left leg and use the knee immobilizer until you are able to follow-up with an orthopedic provider or until the pain improved significantly.  We have given you an injection of  Toradol  today-do not take any other NSAIDs (ibuprofen, Advil, Motrin, Aleve) for 48 hours.  You can take Tylenol 500 to 1000 mg every 6 hours as needed for pain.  Also recommend rest, ice, elevation of  the left knee to help with pain and swelling.  Follow-up with orthopedic provider if pain persists/worsens despite treatment - contact information has been provided.    ED Prescriptions   None    PDMP not reviewed this encounter.   Chandra Harlene LABOR, NP 12/23/23 [phone]
